# Patient Record
Sex: Female | Born: 1940 | Race: White | Hispanic: No
Health system: Southern US, Community
[De-identification: ages and names within clinical notes are randomized; demographics above are authoritative.]

## PROBLEM LIST (undated history)

## (undated) DIAGNOSIS — F0280 Dementia in other diseases classified elsewhere without behavioral disturbance: Secondary | ICD-10-CM

---

## 2018-03-11 ENCOUNTER — Emergency Department (HOSPITAL_COMMUNITY): Admission: EM | Admit: 2018-03-11 | Payer: Self-pay | Source: Home / Self Care

## 2018-03-11 ENCOUNTER — Other Ambulatory Visit: Payer: Self-pay

## 2018-03-11 ENCOUNTER — Emergency Department (HOSPITAL_COMMUNITY)
Admission: EM | Admit: 2018-03-11 | Discharge: 2018-03-12 | Disposition: A | Discharge status: Home / Self Care | Payer: Medicare Other | Attending: Emergency Medicine | Admitting: Emergency Medicine

## 2018-03-11 DIAGNOSIS — I10 Essential (primary) hypertension: Secondary | ICD-10-CM | POA: Insufficient documentation

## 2018-03-11 DIAGNOSIS — W19XXXA Unspecified fall, initial encounter: Secondary | ICD-10-CM

## 2018-03-11 DIAGNOSIS — Z043 Encounter for examination and observation following other accident: Secondary | ICD-10-CM | POA: Insufficient documentation

## 2018-03-11 DIAGNOSIS — F0390 Unspecified dementia without behavioral disturbance: Secondary | ICD-10-CM | POA: Insufficient documentation

## 2018-03-11 DIAGNOSIS — W010XXA Fall on same level from slipping, tripping and stumbling without subsequent striking against object, initial encounter: Secondary | ICD-10-CM | POA: Diagnosis not present

## 2018-03-11 NOTE — ED Triage Notes (Signed)
Patient brought in by EMS from SNF c/o witnessed fall. Per EMS caregiver stated he fell on her back and hit her head in the floor. No LOC reported. Pt Denies pain.

## 2018-03-11 NOTE — ED Notes (Signed)
Bed: WA20 Expected date:  Expected time:  Means of arrival:  Comments: Ems-fall  

## 2018-03-12 ENCOUNTER — Emergency Department (HOSPITAL_COMMUNITY): Payer: Medicare Other

## 2018-03-12 NOTE — ED Provider Notes (Signed)
[facility]   CSN: [id number] Arrival date & time: 03/11/18  2337     History   Chief Complaint Chief Complaint  Patient presents with  . Fall    HPI Madison Walton is a 77 y.o. female.  HPI    77 year old female with history of severe dementia here with fall.  Fall was witnessed.  The patient reportedly fell backwards and struck her head.  She was able to get herself up.  She is not on blood thinners.  She was sent here for further evaluation.  On my assessment, patient unable to provide history due to severe dementia.  Level 5 caveat invoked as remainder of history, ROS, and physical exam limited due to patient's dementia.   No past medical history on file.   Dementia, severe HTN  There are no active problems to display for this patient.  No relevant SHx  FHx: unable to assess   OB History   None      Home Medications    Prior to Admission medications   Not on File    Family History No family history on file.  Social History Social History   Tobacco Use  . Smoking status: Not on file  Substance Use Topics  . Alcohol use: Not on file  . Drug use: Not on file     Allergies   Patient has no allergy information on record.   Review of Systems Review of Systems  Unable to perform ROS: Mental status change     Physical Exam Updated Vital Signs BP (!) 151/106   Pulse (!) 55   Temp 97.9 F (36.6 C) (Oral)   Resp 16   SpO2 98%   Physical Exam  Constitutional: She appears well-developed and well-nourished. No distress.  HENT:  Head: Normocephalic and atraumatic.  No apparent head trauma. No hemotympanum. No bruising. No hematomas.  Eyes: Conjunctivae are normal.  Neck: Neck supple.  Cardiovascular: Normal rate, regular rhythm and normal heart sounds.  Pulmonary/Chest: Effort normal. No respiratory distress. She has no wheezes.  Abdominal: She exhibits no distension.    Musculoskeletal: She exhibits no edema.  No midline or paraspinal TTP C,T,L spine. No TTP over b/l UE and LE.  Neurological: She is alert. She exhibits normal muscle tone.  Disoriented, which is baseline. MAE. Face symmetric.  Skin: Skin is warm. Capillary refill takes less than 2 seconds. No rash noted.  Nursing note and vitals reviewed.    ED Treatments / Results  Labs (all labs ordered are listed, but only abnormal results are displayed) Labs Reviewed - No data to display  EKG None  Radiology Ct Head Wo Contrast  Result Date: 03/12/2018 CLINICAL DATA:  Patient fell at nursing home hitting the posterior head. EXAM: CT HEAD WITHOUT CONTRAST CT CERVICAL SPINE WITHOUT CONTRAST TECHNIQUE: Multidetector CT imaging of the head and cervical spine was performed following the standard protocol without intravenous contrast. Multiplanar CT image reconstructions of the cervical spine were also generated. COMPARISON:  None. FINDINGS: CT HEAD FINDINGS BRAIN: There is mild sulcal and moderate to marked ventricular prominence consistent with superficial and central atrophy. No intraparenchymal hemorrhage, mass effect nor midline shift. Periventricular and subcortical white matter hypodensities consistent with chronic mild small vessel ischemic disease are identified. No acute large vascular territory infarcts. No abnormal extra-axial fluid collections. Basal cisterns are not effaced and midline. VASCULAR: Moderate calcific atherosclerosis of the carotid siphons. SKULL: No skull fracture. No significant scalp soft  tissue swelling. SINUSES/ORBITS: The mastoid air-cells are clear. The included paranasal sinuses are well-aerated.The included ocular globes and orbital contents are non-suspicious. OTHER: None. CT CERVICAL SPINE FINDINGS Alignment: Maintained cervical lordosis. Skull base and vertebrae: No acute fracture. No primary bone lesion or focal pathologic process. Soft tissues and spinal canal: No  prevertebral fluid or swelling. No visible canal hematoma. Disc levels: Moderate to marked disc flattening C3 through C6 with small posterior marginal osteophytes. Uncovertebral joint osteoarthritis is identified bilaterally with uncinate spurring from C3 through C6. These contribute to mild left and mild-to-moderate right foraminal encroachment, greatest at C4-5 and C5-6. Upper chest: Scarring at the apices.  No dominant mass. Other: Bilateral left-greater-than-right extracranial carotid arteriosclerosis. IMPRESSION: 1. Superficial and central atrophy with minimal small vessel ischemic disease of the brain. No acute intracranial abnormality. 2. No acute cervical spine fracture or static listhesis. 3. 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 Electronically Signed   By: Tollie Eth M.D.   On: 03/12/2018 02:01   Ct Cervical Spine Wo Contrast  Result Date: 03/12/2018 CLINICAL DATA:  Patient fell at nursing home hitting the posterior head. EXAM: CT HEAD WITHOUT CONTRAST CT CERVICAL SPINE WITHOUT CONTRAST TECHNIQUE: Multidetector CT imaging of the head and cervical spine was performed following the standard protocol without intravenous contrast. Multiplanar CT image reconstructions of the cervical spine were also generated. COMPARISON:  None. FINDINGS: CT HEAD FINDINGS BRAIN: There is mild sulcal and moderate to marked ventricular prominence consistent with superficial and central atrophy. No intraparenchymal hemorrhage, mass effect nor midline shift. Periventricular and subcortical white matter hypodensities consistent with chronic mild small vessel ischemic disease are identified. No acute large vascular territory infarcts. No abnormal extra-axial fluid collections. Basal cisterns are not effaced and midline. VASCULAR: Moderate calcific atherosclerosis of the carotid siphons. SKULL: No skull fracture. No significant scalp soft tissue swelling.  SINUSES/ORBITS: The mastoid air-cells are clear. The included paranasal sinuses are well-aerated.The included ocular globes and orbital contents are non-suspicious. OTHER: None. CT CERVICAL SPINE FINDINGS Alignment: Maintained cervical lordosis. Skull base and vertebrae: No acute fracture. No primary bone lesion or focal pathologic process. Soft tissues and spinal canal: No prevertebral fluid or swelling. No visible canal hematoma. Disc levels: Moderate to marked disc flattening C3 through C6 with small posterior marginal osteophytes. Uncovertebral joint osteoarthritis is identified bilaterally with uncinate spurring from C3 through C6. These contribute to mild left and mild-to-moderate right foraminal encroachment, greatest at C4-5 and C5-6. Upper chest: Scarring at the apices.  No dominant mass. Other: Bilateral left-greater-than-right extracranial carotid arteriosclerosis. IMPRESSION: 1. Superficial and central atrophy with minimal small vessel ischemic disease of the brain. No acute intracranial abnormality. 2. No acute cervical spine fracture or static listhesis. 3. 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 Electronically Signed   By: Tollie Eth M.D.   On: 03/12/2018 02:01    Procedures Procedures (including critical care time)  Medications Ordered in ED Medications - No data to display   Initial Impression / Assessment and Plan / ED Course  I have reviewed the triage vital signs and the nursing notes.  Pertinent labs & imaging results that were available during my care of the patient were reviewed by me and considered in my medical decision making (see chart for details).     77 yo F here with witnessed fall. Not on anticoagulants. CT neg. No other signs of trauma on full physical. VSS (mild HTN likely  2/2 agitation from BP cuff 2/2 dementia).  Final Clinical Impressions(s) / ED Diagnoses   Final diagnoses:  Fall, initial  encounter    ED Discharge Orders    None       Shaune Pollack, MD 03/12/18 [phone]

## 2018-07-07 ENCOUNTER — Emergency Department (HOSPITAL_COMMUNITY): Payer: Medicare Other

## 2018-07-07 ENCOUNTER — Emergency Department (HOSPITAL_COMMUNITY)
Admission: EM | Admit: 2018-07-07 | Discharge: 2018-07-07 | Disposition: A | Discharge status: Home / Self Care | Payer: Medicare Other | Attending: Emergency Medicine | Admitting: Emergency Medicine

## 2018-07-07 ENCOUNTER — Other Ambulatory Visit: Payer: Self-pay

## 2018-07-07 DIAGNOSIS — F0391 Unspecified dementia with behavioral disturbance: Secondary | ICD-10-CM | POA: Diagnosis not present

## 2018-07-07 DIAGNOSIS — Z79899 Other long term (current) drug therapy: Secondary | ICD-10-CM | POA: Diagnosis not present

## 2018-07-07 DIAGNOSIS — R0602 Shortness of breath: Secondary | ICD-10-CM

## 2018-07-07 DIAGNOSIS — R451 Restlessness and agitation: Secondary | ICD-10-CM | POA: Diagnosis present

## 2018-07-07 LAB — COMPREHENSIVE METABOLIC PANEL
ALT: 14 U/L (ref 0–44)
AST: 24 U/L (ref 15–41)
Albumin: 3.9 g/dL (ref 3.5–5.0)
Alkaline Phosphatase: 90 U/L (ref 38–126)
Anion gap: 7 (ref 5–15)
BUN: 35 mg/dL — ABNORMAL HIGH (ref 8–23)
CHLORIDE: 110 mmol/L (ref 98–111)
CO2: 25 mmol/L (ref 22–32)
CREATININE: 0.8 mg/dL (ref 0.44–1.00)
Calcium: 9 mg/dL (ref 8.9–10.3)
GFR calc Af Amer: >60 mL/min (ref >60)
GFR calc non Af Amer: >60 mL/min (ref >60)
Glucose, Bld: 89 mg/dL (ref 70–99)
POTASSIUM: 3.9 mmol/L (ref 3.5–5.1)
Sodium: 142 mmol/L (ref 135–145)
Total Bilirubin: 0.5 mg/dL (ref 0.3–1.2)
Total Protein: 6.8 g/dL (ref 6.5–8.1)

## 2018-07-07 LAB — CBC WITH DIFFERENTIAL/PLATELET
ABS IMMATURE GRANULOCYTES: 0.03 10*3/uL (ref 0.00–0.07)
BASOS ABS: 0 10*3/uL (ref 0.0–0.1)
Basophils Relative: 1 %
Eosinophils Absolute: 0.1 10*3/uL (ref 0.0–0.5)
Eosinophils Relative: 1 %
HEMATOCRIT: 33.8 % — AB (ref 36.0–46.0)
HEMOGLOBIN: 10.2 g/dL — AB (ref 12.0–15.0)
IMMATURE GRANULOCYTES: 0 %
LYMPHS ABS: 2.2 10*3/uL (ref 0.7–4.0)
LYMPHS PCT: 27 %
MCH: 26.4 pg (ref 26.0–34.0)
MCHC: 30.2 g/dL (ref 30.0–36.0)
MCV: 87.6 fL (ref 80.0–100.0)
Monocytes Absolute: 1.2 10*3/uL — ABNORMAL HIGH (ref 0.1–1.0)
Monocytes Relative: 14 %
NEUTROS ABS: 4.6 10*3/uL (ref 1.7–7.7)
NEUTROS PCT: 57 %
Platelets: 243 10*3/uL (ref 150–400)
RBC: 3.86 MIL/uL — AB (ref 3.87–5.11)
RDW: 17.2 % — ABNORMAL HIGH (ref 11.5–15.5)
WBC: 8.1 10*3/uL (ref 4.0–10.5)
nRBC: 0 % (ref 0.0–0.2)

## 2018-07-07 LAB — URINALYSIS, ROUTINE W REFLEX MICROSCOPIC
Bacteria, UA: NONE SEEN
Bilirubin Urine: NEGATIVE
GLUCOSE, UA: NEGATIVE mg/dL
Ketones, ur: 5 mg/dL — AB
LEUKOCYTES UA: NEGATIVE
NITRITE: NEGATIVE
Protein, ur: NEGATIVE mg/dL
SPECIFIC GRAVITY, URINE: 1.021 (ref 1.005–1.030)
pH: 5 (ref 5.0–8.0)

## 2018-07-07 MED ORDER — LORAZEPAM 2 MG/ML IJ SOLN: 1.0000 mg | Freq: Once | INTRAMUSCULAR | Status: DC

## 2018-07-07 MED ORDER — LORAZEPAM 2 MG/ML IJ SOLN
1.0000 mg | Freq: Once | INTRAMUSCULAR | Status: AC
  Administered 2018-07-07: 1 mg via INTRAVENOUS
  Filled 2018-07-07: qty 1

## 2018-07-07 NOTE — Discharge Instructions (Addendum)
Extensive work-up here tonight without any acute findings to include chest x-ray CT had labs and urinalysis.  Patient did settle down with Ativan.  We gave 1 mg every 6 hours.  Would recommend may be continuing that at the nursing facility.  Patient stable for discharge back to nursing facility.

## 2018-07-07 NOTE — ED Notes (Signed)
Attempted to call [facility] to find out. Patient has dementia, confusion all the time but she is verbal, able to ambulate on her own, patient is incontinent per nursing home. Son was at nursing home today, son was concerned that she was confused and talking to people that weren't there. Nursing home nurse reported she has never seen [place] like she was acting today, she was biting herself and other residents. Son wants a psych eval to maybe get her on Seroquil.

## 2018-07-07 NOTE — ED Triage Notes (Signed)
Patient presented to ed with c/o agitation. Patient been progressive go worse. Patient was given lorazepam 0.5 mg x 2 at nursing facility today. Patient was also given versed 5 mg IM per m.d due to patient being combative on route to ed.

## 2018-07-07 NOTE — ED Notes (Addendum)
Report given to nurse at [facility], patient is in RM 28. [facility] called for transport.

## 2018-07-07 NOTE — ED Notes (Signed)
[facility] notified of need for transport back to [facility] 

## 2018-07-07 NOTE — ED Notes (Signed)
Bed: [id number] Expected date:  Expected time:  Means of arrival:  Comments: Room 25

## 2018-07-07 NOTE — ED Provider Notes (Signed)
[facility] Provider Note   CSN: [id number] Arrival date & time: 07/07/18  1630     History   Chief Complaint No chief complaint on file.   HPI Madison Walton is a 78 y.o. female.  Patient sent in from nursing facility.  Patient has a history of dementia but for today was very agitated.  Apparently bit some of the staff.  EMS gave her Lorazepam March she got Lorazepam at the nursing facility 0.5 mg x 2 EMS gave her 5 mg IM.  When she arrived here she was very sedated.  As time went on she did get a more alert and got a little bit out of control but never got abusive.  Patient continue to receive some Ativan here.     No past medical history on file.  There are no active problems to display for this patient.     OB History   No obstetric history on file.      Home Medications    Prior to Admission medications   Medication Sig Start Date End Date Taking? Authorizing Provider  divalproex (DEPAKOTE SPRINKLE) 125 MG capsule Take 250 mg by mouth 2 (two) times daily.   Yes [provider]  LORazepam (ATIVAN) 0.5 MG tablet Take 0.5 mg by mouth every 6 (six) hours as needed (agitation).   Yes [provider]  mirtazapine (REMERON) 7.5 MG tablet Take 7.5 mg by mouth at bedtime.   Yes [provider]  sertraline (ZOLOFT) 25 MG tablet Take 25 mg by mouth daily.   Yes [provider]  vitamin B-12 (CYANOCOBALAMIN) 500 MCG tablet Take 500 mcg by mouth daily.   Yes [provider]    Family History No family history on file.  Social History Social History   Tobacco Use  . Smoking status: Not on file  Substance Use Topics  . Alcohol use: Not on file  . Drug use: Not on file     Allergies   Patient has no known allergies.   Review of Systems Review of Systems  Unable to perform ROS: Dementia     Physical Exam Updated Vital Signs BP 110/65 (BP Location: Left Arm)   Pulse (!) 52    Temp 98 F (36.7 C) (Oral)   Resp 16   SpO2 95%   Physical Exam Vitals signs and nursing note reviewed.  Constitutional:      General: She is not in acute distress.    Appearance: She is well-developed.  HENT:     Head: Normocephalic and atraumatic.     Mouth/Throat:     Mouth: Mucous membranes are moist.  Eyes:     Extraocular Movements: Extraocular movements intact.     Conjunctiva/sclera: Conjunctivae normal.     Pupils: Pupils are equal, round, and reactive to light.  Neck:     Musculoskeletal: Neck supple.  Cardiovascular:     Rate and Rhythm: Normal rate and regular rhythm.     Heart sounds: No murmur.  Pulmonary:     Effort: Pulmonary effort is normal. No respiratory distress.     Breath sounds: Normal breath sounds.  Abdominal:     Palpations: Abdomen is soft.     Tenderness: There is no abdominal tenderness.  Musculoskeletal: Normal range of motion.  Skin:    General: Skin is warm and dry.  Neurological:     General: No focal deficit present.     Mental Status: She is alert.  Comments: Patient alert.  At times will get agitated.  Difficult to refocus.  But not aggressive or abusive.      ED Treatments / Results  Labs (all labs ordered are listed, but only abnormal results are displayed) Labs Reviewed  URINALYSIS, ROUTINE W REFLEX MICROSCOPIC - Abnormal; Notable for the following components:      Result Value   Hgb urine dipstick MODERATE (*)    Ketones, ur 5 (*)    All other components within normal limits  COMPREHENSIVE METABOLIC PANEL - Abnormal; Notable for the following components:   BUN 35 (*)    All other components within normal limits  CBC WITH DIFFERENTIAL/PLATELET - Abnormal; Notable for the following components:   RBC 3.86 (*)    Hemoglobin 10.2 (*)    HCT 33.8 (*)    RDW 17.2 (*)    Monocytes Absolute 1.2 (*)    All other components within normal limits  URINE CULTURE    EKG None  Radiology Dg Chest 1 View  Result Date:  07/07/2018 CLINICAL DATA:  Altered mental status, combative, uncooperative EXAM: CHEST  1 VIEW COMPARISON:  None FINDINGS: Rotated to the RIGHT. Normal heart size and pulmonary vascularity. Large opacity at the inferior mediastinum may either represent a rotated cardiac silhouette or potentially a superimposed large hiatal hernia Lungs clear. No pulmonary infiltrate, pleural effusion or pneumothorax. Bones demineralized. Chronic RIGHT rotator cuff tear. IMPRESSION: Question large hiatal hernia; recommend attention on follow-up imaging to confirm. No acute infiltrate. Electronically Signed   By: Ulyses SouthwardMark  Boles M.D.   On: 07/07/2018 18:45   Ct Head Wo Contrast  Result Date: 07/07/2018 CLINICAL DATA:  Altered level of consciousness.  Agitation. EXAM: CT HEAD WITHOUT CONTRAST TECHNIQUE: Contiguous axial images were obtained from the base of the skull through the vertex without intravenous contrast. COMPARISON:  03/12/2018 FINDINGS: Despite efforts by the technologist and patient, motion artifact is present on today's exam and could not be eliminated. This reduces exam sensitivity and specificity. Brain: The stable ventriculomegaly and periventricular white matter hypodensity. The temporal horns are less dilated than the occipital horns. This is similar to previous. Generalized atrophy with prominent sulci. Vascular: There is atherosclerotic calcification of the cavernous carotid arteries bilaterally. Skull: Unremarkable Sinuses/Orbits: Unremarkable Other: No supplemental non-categorized findings. IMPRESSION: 1. No acute intracranial findings. 2. Stable ventriculomegaly and generalized atrophy. 3. Mild periventricular white matter hypodensity probably a manifestation of chronic ischemic microvascular white matter disease. 4. Atherosclerosis. Electronically Signed   By: Gaylyn RongWalter  Liebkemann M.D.   On: 07/07/2018 17:58    Procedures Procedures (including critical care time)  Medications Ordered in ED Medications    LORazepam (ATIVAN) injection 1 mg (has no administration in time range)  LORazepam (ATIVAN) injection 1 mg (1 mg Intravenous Given 07/07/18 1918)     Initial Impression / Assessment and Plan / ED Course  I have reviewed the triage vital signs and the nursing notes.  Pertinent labs & imaging results that were available during my care of the patient were reviewed by me and considered in my medical decision making (see chart for details).     Patient here with history of dementia.  Extensive work-up to include labs chest x-ray CT head without any acute findings.  Patient seemed to do well with Ativan every 6 hours.  Do not see any indication for psych evaluation patient due to her dementia they can communicate well with.  But no evidence of any lab x-ray or head CT abnormalities also no  evidence of urinary tract infection.  Patient now resting comfortably.  Feels patient stable to return back to facility.  Her doctor  Final Clinical Impressions(s) / ED Diagnoses   Final diagnoses:  Dementia with behavioral disturbance, unspecified dementia type [facility])    ED Discharge Orders    None       Vanetta Mulders, MD 07/07/18 2305

## 2018-07-07 NOTE — ED Notes (Signed)
Bed: DG64 Expected date:  Expected time:  Means of arrival:  Comments: 78 yo hallucinations

## 2018-07-07 NOTE — ED Notes (Signed)
Patient was straight cathed for a urine sample. About 50 mL of urine returned. New brief and new linens were given, as patient is incontinent. Mittens applied as well as new gown and warm blanket.

## 2018-07-07 NOTE — ED Notes (Signed)
Patient transported to CT 

## 2018-07-10 LAB — URINE CULTURE: CULTURE: NO GROWTH

## 2018-11-07 ENCOUNTER — Other Ambulatory Visit: Payer: Self-pay

## 2018-11-07 ENCOUNTER — Emergency Department (HOSPITAL_COMMUNITY): Payer: Medicare Other

## 2018-11-07 ENCOUNTER — Encounter (HOSPITAL_COMMUNITY): Payer: Self-pay | Admitting: Emergency Medicine

## 2018-11-07 ENCOUNTER — Emergency Department (HOSPITAL_COMMUNITY)
Admission: EM | Admit: 2018-11-07 | Discharge: 2018-11-07 | Disposition: A | Discharge status: Home / Self Care | Payer: Medicare Other | Attending: Emergency Medicine | Admitting: Emergency Medicine

## 2018-11-07 DIAGNOSIS — Z23 Encounter for immunization: Secondary | ICD-10-CM | POA: Insufficient documentation

## 2018-11-07 DIAGNOSIS — S0101XA Laceration without foreign body of scalp, initial encounter: Secondary | ICD-10-CM | POA: Diagnosis present

## 2018-11-07 DIAGNOSIS — Z79899 Other long term (current) drug therapy: Secondary | ICD-10-CM | POA: Diagnosis not present

## 2018-11-07 DIAGNOSIS — Y999 Unspecified external cause status: Secondary | ICD-10-CM | POA: Insufficient documentation

## 2018-11-07 DIAGNOSIS — Y92129 Unspecified place in nursing home as the place of occurrence of the external cause: Secondary | ICD-10-CM | POA: Insufficient documentation

## 2018-11-07 DIAGNOSIS — W0110XA Fall on same level from slipping, tripping and stumbling with subsequent striking against unspecified object, initial encounter: Secondary | ICD-10-CM | POA: Diagnosis not present

## 2018-11-07 DIAGNOSIS — Y939 Activity, unspecified: Secondary | ICD-10-CM | POA: Insufficient documentation

## 2018-11-07 MED ORDER — TETANUS-DIPHTH-ACELL PERTUSSIS 5-2.5-18.5 LF-MCG/0.5 IM SUSP
0.5000 mL | Freq: Once | INTRAMUSCULAR | Status: AC
  Administered 2018-11-07: 0.5 mL via INTRAMUSCULAR
  Filled 2018-11-07 (×2): qty 0.5

## 2018-11-07 MED ORDER — LIDOCAINE-EPINEPHRINE (PF) 2 %-1:200000 IJ SOLN
10.0000 mL | Freq: Once | INTRAMUSCULAR | Status: AC
  Administered 2018-11-07: 10 mL via INTRADERMAL
  Filled 2018-11-07: qty 10

## 2018-11-07 NOTE — Discharge Instructions (Signed)
Have the staples removed in 5 to 7 days.  He can return to the ED or be seen in urgent care or have it done at your doctor's office.

## 2018-11-07 NOTE — ED Notes (Signed)
[facility] at bedside to transport patient back to the facility. Patient belongings taken with patient and PTAR. Patient unable to sign for discharge due to dementia. Patient stable at discharge. Attempted to call report again, with no answer.

## 2018-11-07 NOTE — ED Notes (Signed)
Attempted to contact [facility] to give report, but no answer. [facility] contacted for transport. Paperwork printed and at nurses station. Will continue to monitor patient.

## 2018-11-07 NOTE — ED Notes (Signed)
Bed: WA17 Expected date:  Expected time:  Means of arrival:  Comments: EMS fall 

## 2018-11-07 NOTE — ED Notes (Signed)
Patient transported to CT 

## 2018-11-07 NOTE — ED Triage Notes (Signed)
Pt BIBA from [facility], witnessed mechanical fall.  Pt was walking and tripped, fell to floor hit back of head.  Per facility staff, pt does not take blood thinners. Staff reports pt is at baseline.    Hx of dementia.

## 2018-11-07 NOTE — ED Provider Notes (Signed)
[facility] DEPT Provider Note   CSN: [id number] Arrival date & time: 11/07/18  1830    History   Chief Complaint Chief Complaint  Patient presents with  . Fall    HPI Madison Walton is a 78 y.o. female.     78 yo F with a chief complaints of fall.  Patient has a history of frequent falls and fell while ambulating.  Witnessed fall.  Struck the back of her head.  Currently at her mental baseline per the nursing home.  Patient is demented and difficult to obtain history level 5 caveat dementia.  The history is provided by the patient.  Fall  This is a new problem. The current episode started 3 to 5 hours ago. The problem occurs constantly. The problem has not changed since onset.Pertinent negatives include no chest pain, no abdominal pain, no headaches and no shortness of breath. Nothing aggravates the symptoms. Nothing relieves the symptoms. She has tried nothing for the symptoms. The treatment provided no relief.    History reviewed. No pertinent past medical history.  There are no active problems to display for this patient.   History reviewed. No pertinent surgical history.   OB History   No obstetric history on file.      Home Medications    Prior to Admission medications   Medication Sig Start Date End Date Taking? Authorizing Provider  divalproex (DEPAKOTE SPRINKLE) 125 MG capsule Take 250 mg by mouth 2 (two) times daily.    [provider]  LORazepam (ATIVAN) 0.5 MG tablet Take 0.5 mg by mouth every 6 (six) hours as needed (agitation).    [provider]  mirtazapine (REMERON) 7.5 MG tablet Take 7.5 mg by mouth at bedtime.    [provider]  sertraline (ZOLOFT) 25 MG tablet Take 25 mg by mouth daily.    [provider]  vitamin B-12 (CYANOCOBALAMIN) 500 MCG tablet Take 500 mcg by mouth daily.    [provider]    Family History No family history on file.  Social History  Social History   Tobacco Use  . Smoking status: Not on file  Substance Use Topics  . Alcohol use: Not on file  . Drug use: Not on file     Allergies   Patient has no known allergies.   Review of Systems Review of Systems  Constitutional: Negative for chills and fever.  HENT: Negative for congestion and rhinorrhea.   Eyes: Negative for redness and visual disturbance.  Respiratory: Negative for shortness of breath and wheezing.   Cardiovascular: Negative for chest pain and palpitations.  Gastrointestinal: Negative for abdominal pain, nausea and vomiting.  Genitourinary: Negative for dysuria and urgency.  Musculoskeletal: Negative for arthralgias and myalgias.  Skin: Positive for wound. Negative for pallor.  Neurological: Negative for dizziness and headaches.     Physical Exam Updated Vital Signs BP 101/78   Pulse 63   Temp 98.2 F (36.8 C) (Oral)   Resp 20   SpO2 99%   Physical Exam Vitals signs and nursing note reviewed.  Constitutional:      General: She is not in acute distress.    Appearance: She is well-developed. She is not diaphoretic.  HENT:     Head: Normocephalic.     Comments: 2.6 cm lac to the occiput.  Eyes:     Pupils: Pupils are equal, round, and reactive to light.  Neck:     Musculoskeletal: Normal range of motion and neck  supple.  Cardiovascular:     Rate and Rhythm: Normal rate and regular rhythm.     Heart sounds: No murmur. No friction rub. No gallop.   Pulmonary:     Effort: Pulmonary effort is normal.     Breath sounds: No wheezing or rales.  Abdominal:     General: There is no distension.     Palpations: Abdomen is soft.     Tenderness: There is no abdominal tenderness.  Musculoskeletal:        General: No tenderness.  Skin:    General: Skin is warm and dry.  Neurological:     Mental Status: She is alert and oriented to person, place, and time.  Psychiatric:        Behavior: Behavior normal.      ED Treatments / Results   Labs (all labs ordered are listed, but only abnormal results are displayed) Labs Reviewed - No data to display  EKG None  Radiology Ct Head Wo Contrast  Result Date: 11/07/2018 CLINICAL DATA:  Mechanical fall. The patient hit the back of their head. EXAM: CT HEAD WITHOUT CONTRAST TECHNIQUE: Contiguous axial images were obtained from the base of the skull through the vertex without intravenous contrast. COMPARISON:  07/07/2018 FINDINGS: Brain: No evidence of acute infarction, hemorrhage, hydrocephalus, extra-axial collection or mass lesion/mass effect. Volume loss and ventriculomegaly is again noted. There are scattered chronic microvascular ischemic changes. Vascular: No hyperdense vessel or unexpected calcification. Skull: Normal. Negative for fracture or focal lesion. Sinuses/Orbits: No acute finding. The patient is status post prior bilateral cataract surgery Other: None. IMPRESSION: 1. No acute intracranial abnormality detected. 2. Chronic findings as above. Electronically Signed   By: Katherine Mantlehristopher  Green M.D.   On: 11/07/2018 19:22    Procedures .Marland Kitchen.Laceration Repair Date/Time: 11/07/2018 7:59 PM Performed by: Melene PlanFloyd, Janece Laidlaw, DO Authorized by: Melene PlanFloyd, Easten Maceachern, DO   Consent:    Consent obtained:  Verbal   Consent given by:  Patient   Risks discussed:  Infection, pain, poor cosmetic result and poor wound healing   Alternatives discussed:  No treatment and delayed treatment Anesthesia (see MAR for exact dosages):    Anesthesia method:  None Laceration details:    Location:  Scalp   Scalp location:  Occipital   Length (cm):  2.6 Repair type:    Repair type:  Simple Pre-procedure details:    Preparation:  Patient was prepped and draped in usual sterile fashion   (including critical care time)  Medications Ordered in ED Medications  lidocaine-EPINEPHrine (XYLOCAINE W/EPI) 2 %-1:200000 (PF) injection 10 mL (10 mLs Intradermal Given by Other 11/07/18 1901)  Tdap (BOOSTRIX) injection 0.5 mL  (0.5 mLs Intramuscular Given 11/07/18 1958)     Initial Impression / Assessment and Plan / ED Course  I have reviewed the triage vital signs and the nursing notes.  Pertinent labs & imaging results that were available during my care of the patient were reviewed by me and considered in my medical decision making (see chart for details).        78 yo F with a chief complaint of a fall.  Sounds mechanical by history.  Has a small act to the occiput.  We will stapled at bedside.  CT of the head.  She has no midline C-spine tenderness is rotating her head 45 degrees without pain.  CT the head is negative for acute intracranial pathology.  The patient's wound was repaired at bedside.  Patient is demented and was having difficulty with even  very mild touch to her wound.  I made the decision to apply one staple instead of 2 injections of lidocaine followed up by a staple. PCP follow up.   8:01 PM:  I have discussed the diagnosis/risks/treatment options with the patient and believe the pt to be eligible for discharge home to follow-up with PCP. We also discussed returning to the ED immediately if new or worsening sx occur. We discussed the sx which are most concerning (e.g., sudden worsening pain, fever, inability to tolerate by mouth) that necessitate immediate return. Medications administered to the patient during their visit and any new prescriptions provided to the patient are listed below.  Medications given during this visit Medications  lidocaine-EPINEPHrine (XYLOCAINE W/EPI) 2 %-1:200000 (PF) injection 10 mL (10 mLs Intradermal Given by Other 11/07/18 1901)  Tdap (BOOSTRIX) injection 0.5 mL (0.5 mLs Intramuscular Given 11/07/18 1958)     The patient appears reasonably screen and/or stabilized for discharge and I doubt any other medical condition or other [facility] requiring further screening, evaluation, or treatment in the ED at this time prior to discharge.    Final Clinical Impressions(s) / ED  Diagnoses   Final diagnoses:  Laceration of scalp, initial encounter    ED Discharge Orders    None       Melene PlanFloyd, Elsbeth Yearick, DO 11/07/18 2001

## 2018-12-21 ENCOUNTER — Emergency Department (HOSPITAL_COMMUNITY): Payer: Medicare Other

## 2018-12-21 ENCOUNTER — Emergency Department (HOSPITAL_COMMUNITY)
Admission: EM | Admit: 2018-12-21 | Discharge: 2018-12-21 | Disposition: A | Discharge status: Home / Self Care | Payer: Medicare Other | Attending: Emergency Medicine | Admitting: Emergency Medicine

## 2018-12-21 ENCOUNTER — Other Ambulatory Visit: Payer: Self-pay

## 2018-12-21 DIAGNOSIS — Z79899 Other long term (current) drug therapy: Secondary | ICD-10-CM | POA: Insufficient documentation

## 2018-12-21 DIAGNOSIS — F0390 Unspecified dementia without behavioral disturbance: Secondary | ICD-10-CM | POA: Diagnosis not present

## 2018-12-21 DIAGNOSIS — W19XXXA Unspecified fall, initial encounter: Secondary | ICD-10-CM

## 2018-12-21 DIAGNOSIS — W1830XA Fall on same level, unspecified, initial encounter: Secondary | ICD-10-CM | POA: Insufficient documentation

## 2018-12-21 DIAGNOSIS — S0083XA Contusion of other part of head, initial encounter: Secondary | ICD-10-CM | POA: Diagnosis not present

## 2018-12-21 DIAGNOSIS — R51 Headache: Secondary | ICD-10-CM | POA: Diagnosis not present

## 2018-12-21 DIAGNOSIS — Y999 Unspecified external cause status: Secondary | ICD-10-CM | POA: Diagnosis not present

## 2018-12-21 DIAGNOSIS — Y9301 Activity, walking, marching and hiking: Secondary | ICD-10-CM | POA: Diagnosis not present

## 2018-12-21 DIAGNOSIS — S0990XA Unspecified injury of head, initial encounter: Secondary | ICD-10-CM | POA: Diagnosis not present

## 2018-12-21 DIAGNOSIS — Y92129 Unspecified place in nursing home as the place of occurrence of the external cause: Secondary | ICD-10-CM | POA: Diagnosis not present

## 2018-12-21 LAB — CBC WITH DIFFERENTIAL/PLATELET
Abs Immature Granulocytes: 0.02 10*3/uL (ref 0.00–0.07)
Basophils Absolute: 0 10*3/uL (ref 0.0–0.1)
Basophils Relative: 1 %
Eosinophils Absolute: 0.1 10*3/uL (ref 0.0–0.5)
Eosinophils Relative: 1 %
HCT: 40.2 % (ref 36.0–46.0)
Hemoglobin: 12.5 g/dL (ref 12.0–15.0)
Immature Granulocytes: 0 %
Lymphocytes Relative: 25 %
Lymphs Abs: 1.6 10*3/uL (ref 0.7–4.0)
MCH: 27.8 pg (ref 26.0–34.0)
MCHC: 31.1 g/dL (ref 30.0–36.0)
MCV: 89.5 fL (ref 80.0–100.0)
Monocytes Absolute: 0.8 10*3/uL (ref 0.1–1.0)
Monocytes Relative: 11 %
Neutro Abs: 4.1 10*3/uL (ref 1.7–7.7)
Neutrophils Relative %: 62 %
Platelets: 284 10*3/uL (ref 150–400)
RBC: 4.49 MIL/uL (ref 3.87–5.11)
RDW: 16.2 % — ABNORMAL HIGH (ref 11.5–15.5)
WBC: 6.7 10*3/uL (ref 4.0–10.5)
nRBC: 0 % (ref 0.0–0.2)

## 2018-12-21 LAB — BASIC METABOLIC PANEL
Anion gap: 9 (ref 5–15)
BUN: 15 mg/dL (ref 8–23)
CO2: 27 mmol/L (ref 22–32)
Calcium: 9.5 mg/dL (ref 8.9–10.3)
Chloride: 104 mmol/L (ref 98–111)
Creatinine, Ser: 0.64 mg/dL (ref 0.44–1.00)
GFR calc Af Amer: >60 mL/min (ref >60)
GFR calc non Af Amer: >60 mL/min (ref >60)
Glucose, Bld: 96 mg/dL (ref 70–99)
Potassium: 4.1 mmol/L (ref 3.5–5.1)
Sodium: 140 mmol/L (ref 135–145)

## 2018-12-21 LAB — URINALYSIS, ROUTINE W REFLEX MICROSCOPIC
Bilirubin Urine: NEGATIVE
Glucose, UA: NEGATIVE mg/dL
Hgb urine dipstick: NEGATIVE
Ketones, ur: NEGATIVE mg/dL
Leukocytes,Ua: NEGATIVE
Nitrite: NEGATIVE
Protein, ur: NEGATIVE mg/dL
Specific Gravity, Urine: 1.012 (ref 1.005–1.030)
pH: 6 (ref 5.0–8.0)

## 2018-12-21 MED ORDER — ACETAMINOPHEN 325 MG PO TABS
325.0000 mg | ORAL_TABLET | Freq: Once | ORAL | Status: DC
  Filled 2018-12-21: qty 1

## 2018-12-21 NOTE — ED Provider Notes (Signed)
[facility] Provider Note   CSN: [id number] Arrival date & time: 12/21/18  [id number]    History   Chief Complaint Chief Complaint  Patient presents with  . Fall    HPI Madison Walton is a 78 y.o. female with a PMH of Dementia, Legally blind, Depression, and osteoporosis presenting after a witnessed fall this morning. [facility] EMS reports that facility staff witnessed the patient walking with a walker and slowly fall. Patient did hit posterior aspect of head. Facility staff deny LOC. Patient is not on blood thinners. Facility reports patient is at baseline. Patient reports a left sided headache. Patient denies chest pain, shortness of breath, dizziness, abdominal pain, hip pain, back pain, neck pain, or joint pain. Patient is a poor historian due to dementia.   Level 5 caveat.      HPI  No past medical history on file.  There are no active problems to display for this patient.   No past surgical history on file.   OB History   No obstetric history on file.      Home Medications    Prior to Admission medications   Medication Sig Start Date End Date Taking? Authorizing Provider  divalproex (DEPAKOTE SPRINKLE) 125 MG capsule Take 250 mg by mouth 2 (two) times daily.    [provider]  LORazepam (ATIVAN) 0.5 MG tablet Take 0.5 mg by mouth every 6 (six) hours as needed (agitation).    [provider]  mirtazapine (REMERON) 7.5 MG tablet Take 7.5 mg by mouth at bedtime.    [provider]  sertraline (ZOLOFT) 25 MG tablet Take 25 mg by mouth daily.    [provider]  vitamin B-12 (CYANOCOBALAMIN) 500 MCG tablet Take 500 mcg by mouth daily.    [provider]    Family History No family history on file.  Social History Social History   Tobacco Use  . Smoking status: Not on file  Substance Use Topics  . Alcohol use: Not on file  . Drug use: Not  on file     Allergies   Patient has no known allergies.   Review of Systems Review of Systems  Unable to perform ROS: Dementia    Physical Exam Updated Vital Signs BP 131/77 (BP Location: Right Arm)   Pulse 65   Temp 97.6 F (36.4 C) (Axillary)   Resp 16   SpO2 98%   Physical Exam Vitals signs and nursing note reviewed.  Constitutional:      General: She is not in acute distress.    Appearance: She is well-developed. She is not diaphoretic.  HENT:     Head: Normocephalic. No raccoon eyes or Battle's sign.      Right Ear: Tympanic membrane, ear canal and external ear normal. No hemotympanum.     Left Ear: Tympanic membrane, ear canal and external ear normal. No hemotympanum.     Nose: Nose normal. No nasal deformity.     Mouth/Throat:     Mouth: Mucous membranes are moist.     Pharynx: No oropharyngeal exudate or posterior oropharyngeal erythema.  Eyes:     Extraocular Movements: Extraocular movements intact.     Conjunctiva/sclera: Conjunctivae normal.     Pupils: Pupils are equal, round, and reactive to light.  Neck:     Musculoskeletal: Normal range of motion. Muscular tenderness (Mild midline tenderness upon palpation of cervical spine.) present.  Cardiovascular:  Rate and Rhythm: Normal rate and regular rhythm.     Heart sounds: Normal heart sounds. No murmur. No friction rub. No gallop.   Pulmonary:     Effort: Pulmonary effort is normal. No respiratory distress.     Breath sounds: Normal breath sounds. No wheezing or rales.  Chest:     Chest wall: No deformity, swelling or tenderness.  Abdominal:     Palpations: Abdomen is soft.     Tenderness: There is no abdominal tenderness. There is no guarding.  Musculoskeletal: Normal range of motion.        General: No swelling, tenderness or deformity.     Right hip: Normal. She exhibits normal range of motion, normal strength and no tenderness.     Left hip: She exhibits normal range of motion, normal strength  and no tenderness.     Cervical back: She exhibits bony tenderness. She exhibits normal range of motion.     Thoracic back: She exhibits normal range of motion, no tenderness and no bony tenderness.     Lumbar back: Normal. She exhibits normal range of motion, no tenderness and no bony tenderness.     Comments: Patient is moving extremities without difficulty. 5/5 strength in upper and lower extremities. 2+ DP and radial pulses. Sensation intact. Patient is ambulatory.  Skin:    General: Skin is warm.     Findings: No erythema or rash.  Neurological:     Mental Status: She is alert. Mental status is at baseline.     Comments: Patient alert to name only. Patient is unable to state date of birth, place, or today's date. Patient states, "It is 1980 and I am at my sister's house."     ED Treatments / Results  Labs (all labs ordered are listed, but only abnormal results are displayed) Labs Reviewed  CBC WITH DIFFERENTIAL/PLATELET - Abnormal; Notable for the following components:      Result Value   RDW 16.2 (*)    All other components within normal limits  BASIC METABOLIC PANEL  URINALYSIS, ROUTINE W REFLEX MICROSCOPIC    EKG EKG Interpretation  Date/Time:  Friday December 21 2018 07:27:21 EDT Ventricular Rate:  56 PR Interval:    QRS Duration: 93 QT Interval:  419 QTC Calculation: 405 R Axis:   72 Text Interpretation:  Sinus rhythm Probable left atrial enlargement Probable anteroseptal infarct, old No old tracing to compare Confirmed by Dorie Rank [phone]) on 12/21/2018 8:26:55 AM   Radiology Ct Head Wo Contrast  Result Date: 12/21/2018 CLINICAL DATA:  78 year old female with acute head and neck injury following fall. EXAM: CT HEAD WITHOUT CONTRAST CT CERVICAL SPINE WITHOUT CONTRAST TECHNIQUE: Multidetector CT imaging of the head and cervical spine was performed following the standard protocol without intravenous contrast. Multiplanar CT image reconstructions of the cervical spine  were also generated. COMPARISON:  03/12/2018 CT FINDINGS: CT HEAD FINDINGS Brain: No evidence of acute infarction, hemorrhage, hydrocephalus, extra-axial collection or mass lesion/mass effect. Atrophy and chronic small-vessel white matter ischemic changes again noted. Vascular: Carotid atherosclerotic calcifications again noted. Skull: Normal. Negative for fracture or focal lesion. Sinuses/Orbits: No acute abnormality Other: Mild posterior LEFT scalp soft tissue swelling noted. CT CERVICAL SPINE FINDINGS Alignment: No acute subluxation. Unchanged 3 mm spondylolisthesis at C2-3 and C4-5 again noted. Skull base and vertebrae: No acute fracture. No primary bone lesion or focal pathologic process. Soft tissues and spinal canal: No prevertebral fluid or swelling. No visible canal hematoma. Disc levels: Mild to moderate  multilevel degenerative disc disease/spondylosis noted. Upper chest: No acute abnormality Other: None IMPRESSION: 1. No evidence of acute intracranial abnormality. Atrophy and chronic small-vessel white matter ischemic changes. 2. Mild posterior LEFT scalp soft tissue swelling. 3. No static evidence of acute injury to the cervical spine. Chronic degenerative changes as described. Electronically Signed   By: Harmon PierJeffrey  Hu M.D.   On: 12/21/2018 08:38   Ct Cervical Spine Wo Contrast  Result Date: 12/21/2018 CLINICAL DATA:  78 year old female with acute head and neck injury following fall. EXAM: CT HEAD WITHOUT CONTRAST CT CERVICAL SPINE WITHOUT CONTRAST TECHNIQUE: Multidetector CT imaging of the head and cervical spine was performed following the standard protocol without intravenous contrast. Multiplanar CT image reconstructions of the cervical spine were also generated. COMPARISON:  03/12/2018 CT FINDINGS: CT HEAD FINDINGS Brain: No evidence of acute infarction, hemorrhage, hydrocephalus, extra-axial collection or mass lesion/mass effect. Atrophy and chronic small-vessel white matter ischemic changes  again noted. Vascular: Carotid atherosclerotic calcifications again noted. Skull: Normal. Negative for fracture or focal lesion. Sinuses/Orbits: No acute abnormality Other: Mild posterior LEFT scalp soft tissue swelling noted. CT CERVICAL SPINE FINDINGS Alignment: No acute subluxation. Unchanged 3 mm spondylolisthesis at C2-3 and C4-5 again noted. Skull base and vertebrae: No acute fracture. No primary bone lesion or focal pathologic process. Soft tissues and spinal canal: No prevertebral fluid or swelling. No visible canal hematoma. Disc levels: Mild to moderate multilevel degenerative disc disease/spondylosis noted. Upper chest: No acute abnormality Other: None IMPRESSION: 1. No evidence of acute intracranial abnormality. Atrophy and chronic small-vessel white matter ischemic changes. 2. Mild posterior LEFT scalp soft tissue swelling. 3. No static evidence of acute injury to the cervical spine. Chronic degenerative changes as described. Electronically Signed   By: Harmon PierJeffrey  Hu M.D.   On: 12/21/2018 08:38    Procedures Procedures (including critical care time)  Medications Ordered in ED Medications  acetaminophen (TYLENOL) tablet 325 mg (has no administration in time range)     Initial Impression / Assessment and Plan / ED Course  I have reviewed the triage vital signs and the nursing notes.  Pertinent labs & imaging results that were available during my care of the patient were reviewed by me and considered in my medical decision making (see chart for details).  Clinical Course as of Dec 20 904  Fri Dec 21, 2018  0844 1. No evidence of acute intracranial abnormality. Atrophy and chronic small-vessel white matter ischemic changes. 2. Mild posterior LEFT scalp soft tissue swelling. 3. No static evidence of acute injury to the cervical spine. Chronic degenerative changes as described.    CT Head Wo Contrast [AH]  [phone] Called and updated son. Son states he understands and agrees with plan.    [AH]    Clinical Course User Index [AH] Leretha DykesHernandez, [place] P, PA-C      Patient presents after a witnessed fall. Vitals, labs, and imaging reviewed. Patient has full ROM of upper and lower extremities. Patient had a small posterior hematoma and mild headache. CT head and neck are negative for acute intracranial abnormality and no static evidence of acute injury to cervical spine. No concern for closed head injury, lung injury, or intraabdominal injury. Patient is stable in no acute distress. Discussed return precautions and documented with discharge documents. Will discharge patient to facility.   Findings and plan of care discussed with supervising physician Dr. Lynelle DoctorKnapp who personally evaluated and examined this patient.  Final Clinical Impressions(s) / ED Diagnoses   Final diagnoses:  Fall,  initial encounter  Injury of head, initial encounter    ED Discharge Orders    None       Leretha DykesHernandez, Jeray Shugart P, [place]PA-C 12/21/18 [id number]    Linwood DibblesKnapp, Jon, MD 12/22/18 [phone]

## 2018-12-21 NOTE — ED Provider Notes (Signed)
Medical screening examination/treatment/procedure(s) were conducted as a shared visit with non-physician practitioner(s) and myself.  I personally evaluated the patient during the encounter.  Elderly pt with dementia presented with a fall.  In the ED alert, awake, primarily complaining about being examined and wanting to be left alone.  Overall doubt serious injury.  Will check labs, xrays monitor.    EKG Interpretation  Date/Time:  Friday December 21 2018 07:27:21 EDT Ventricular Rate:  56 PR Interval:    QRS Duration: 93 QT Interval:  419 QTC Calculation: 405 R Axis:   72 Text Interpretation:  Sinus rhythm Probable left atrial enlargement Probable anteroseptal infarct, old No old tracing to compare Confirmed by Dorie Rank [phone]) on 12/21/2018 8:26:55 AM         Dorie Rank, MD 12/21/18 [phone]

## 2018-12-21 NOTE — Discharge Instructions (Addendum)
You have been seen today after a fall. Please read and follow all provided instructions.   1. Medications: usual home medications 2. Treatment: rest, drink plenty of fluids 3. Follow Up: Please follow up with your primary doctor in 2-5 days for discussion of your diagnoses and further evaluation after today's visit; if you do not have a primary care doctor use the resource guide provided to find one; Please return to the ER for any new or worsening symptoms. Please obtain all of your results from medical records or have your doctors office obtain the results - share them with your doctor - you should be seen at your doctors office. Call today to arrange your follow up.   Take medications as prescribed. Please review all of the medicines and only take them if you do not have an allergy to them. Return to the emergency room for worsening condition or new concerning symptoms. Follow up with your regular doctor. If you don't have a regular doctor use one of the numbers below to establish a primary care doctor. ?  You should return to the ER if you develop severe or worsening symptoms. If patient has abnormal behavior, develops constant vomiting, becomes lethargic, or starts having any concerning symptoms, please return to the ER.   Emergency Department Resource Guide 1) Find a Doctor and Pay Out of Pocket Although you won't have to find out who is covered by your insurance plan, it is a good idea to ask around and get recommendations. You will then need to call the office and see if the doctor you have chosen will accept you as a new patient and what types of options they offer for patients who are self-pay. Some doctors offer discounts or will set up payment plans for their patients who do not have insurance, but you will need to ask so you aren't surprised when you get to your appointment.  2) Contact Your Local Health Department Not all health departments have doctors that can see patients for sick  visits, but many do, so it is worth a call to see if yours does. If you don't know where your local health department is, you can check in your phone book. The CDC also has a tool to help you locate your state's health department, and many state websites also have listings of all of their local health departments.  3) Find a [facility] If your illness is not likely to be very severe or complicated, you may want to try a walk in clinic. These are popping up all over the country in pharmacies, drugstores, and shopping centers. They're usually staffed by nurse practitioners or physician assistants that have been trained to treat common illnesses and complaints. They're usually fairly quick and inexpensive. However, if you have serious medical issues or chronic medical problems, these are probably not your best option.  No Primary Care Doctor: Call Health Connect at  [phone] - they can help you locate a primary care doctor that  accepts your insurance, provides certain services, etc. Physician Referral Service- [phone]  Chronic Pain Problems: Organization         Address  Phone   Notes  [facility]  [phone] Patients need to be referred by their primary care doctor.   Medication Assistance: Organization         Address  Phone   Notes  [facility] Medication [facility] [address] [place], [place] [address] [phone] --Must  be a resident of [facility] -- Must have NO insurance coverage whatsoever (no Medicaid/ Medicare, etc.) -- The pt. MUST have a primary care doctor that directs their care regularly and follows them in the community   [facility]ist  [phone]   [employer]  [phone]    Agencies that provide inexpensive medical care: Organization         Address  Phone   Notes  [facility]  [phone]   [facility]    [phone]   [facility] [address] [place], [place] [address] [phone]   [facility] [address], [place] [phone]) [phone]   [facility]    [phone]   [facility]    [phone]   [facility]  [address], [place] Phone:  [phone], Fax:  [phone] Hours of Operation:  9 am - 6 pm, M-F.  Also accepts Medicaid/Medicare and self-pay.  [facility]  [address], [place] Phone: [phone]) [phone], Fax: [phone]) [phone]. Hours of Operation:  8:30 am - 5:30 pm, M-F.  Also accepts Medicaid and self-pay.  [facility] [place] [address], [place] Phone: [phone]) [phone]   [facility] [address], [place], [place] [phone], Ext. [phone] Mondays & Thursdays: 7-9 AM.  First 15 patients are seen on a first come, first serve basis.    Medicaid-accepting [facility] Providers:  Organization         Address  Phone   Notes  [facility] [address], [place] [phone] Also accepts self-pay patients.  [facility] [address], [place]  [phone]) [phone]   [facility] [address], [place] [phone]   [facility] [address] [place] [address], [place] [phone]   Renaye RakersVeita Bland [address], [place]   [phone] Only accepts [place] patients after they have their name applied to their card.   Self-Pay (no insurance) in [facility] [place]:  Organization         Address  Phone   Notes  Sickle Cell Patients, [facility] [address], [place] [phone]   [facility] [address], [place] [phone]) [phone]   [facility] [place]  [address], [place] [phone]   [facility]/Dr. Osei-Bonsu  [address] [place] [address],  [place] or [id number], [place] [phone] Phone number for both [place] and [place] locations is the same.  [facility] [address], [place] [phone]   [facility] [address] [place] [address], [place] or [address] [phone]   [facility] [address], [place] [phone], phone; [phone], fax Sees patients 1st and 3rd Saturday of every month.  Must not qualify for public or private insurance (i.e. Medicaid, Medicare, [employer], Veterans' Benefits)  Household income should be no more than 200% of the poverty level The clinic cannot treat you if you are pregnant or think you are pregnant  Sexually transmitted diseases are not treated at the clinic.

## 2018-12-21 NOTE — ED Triage Notes (Signed)
Pt here from [facility]. Had witnessed fall this morning from standing. Quarter size hematoma on the back of her head. Pt denies pain. Pt legally blind, hx of dementia. Pt was ambulatory afterwards, denies pain for EMS. No LOC.

## 2019-01-31 ENCOUNTER — Emergency Department (HOSPITAL_COMMUNITY): Payer: Medicare Other

## 2019-01-31 ENCOUNTER — Other Ambulatory Visit: Payer: Self-pay

## 2019-01-31 ENCOUNTER — Emergency Department (HOSPITAL_COMMUNITY)
Admission: EM | Admit: 2019-01-31 | Discharge: 2019-01-31 | Disposition: A | Discharge status: Home / Self Care | Payer: Medicare Other | Attending: Emergency Medicine | Admitting: Emergency Medicine

## 2019-01-31 DIAGNOSIS — Y92129 Unspecified place in nursing home as the place of occurrence of the external cause: Secondary | ICD-10-CM | POA: Diagnosis not present

## 2019-01-31 DIAGNOSIS — F0390 Unspecified dementia without behavioral disturbance: Secondary | ICD-10-CM | POA: Diagnosis not present

## 2019-01-31 DIAGNOSIS — Y939 Activity, unspecified: Secondary | ICD-10-CM | POA: Insufficient documentation

## 2019-01-31 DIAGNOSIS — S0101XA Laceration without foreign body of scalp, initial encounter: Secondary | ICD-10-CM | POA: Insufficient documentation

## 2019-01-31 DIAGNOSIS — Y999 Unspecified external cause status: Secondary | ICD-10-CM | POA: Diagnosis not present

## 2019-01-31 DIAGNOSIS — W1830XA Fall on same level, unspecified, initial encounter: Secondary | ICD-10-CM | POA: Diagnosis not present

## 2019-01-31 DIAGNOSIS — Z79899 Other long term (current) drug therapy: Secondary | ICD-10-CM | POA: Insufficient documentation

## 2019-01-31 NOTE — Discharge Instructions (Addendum)
Remove staples in 7 to 10 days

## 2019-01-31 NOTE — ED Triage Notes (Signed)
Per EMS: Pt from [facility].  Pt had unwitnessed fall and head laceration.  Pt was quiet after fall, but after a few moments pt was back to her normal self, yelling and cussing at staff.  Pt oriented to self, with hx of dementia.  Pt not on blood thinners.

## 2019-01-31 NOTE — ED Provider Notes (Signed)
[facility]-EMERGENC[facility]PT Provider Note   CSN: [id number] Arrival date & time: 01/31/19  1359     History   Chief Complaint No chief complaint on file.   HPI Maryland PinkFern Sechrest Hanover is a 78 y.o. female.     78 year old female with history of dementia here after unwitnessed fall at the nursing home.  Was noted to have bleeding from her occiput.  Patient was noted to be at her neurological baseline.  Patient is a DNR and she is not on blood thinners.  No other injuries was noted and patient sent here for further evaluation     No past medical history on file.  There are no active problems to display for this patient.   No past surgical history on file.   OB History   No obstetric history on file.      Home Medications    Prior to Admission medications   Medication Sig Start Date End Date Taking? Authorizing Provider  divalproex (DEPAKOTE SPRINKLE) 125 MG capsule Take 250 mg by mouth 2 (two) times daily.    [provider]  LORazepam (ATIVAN) 0.5 MG tablet Take 0.5 mg by mouth every 6 (six) hours as needed (agitation).    [provider]  mirtazapine (REMERON) 7.5 MG tablet Take 7.5 mg by mouth at bedtime.    [provider]  sertraline (ZOLOFT) 25 MG tablet Take 25 mg by mouth daily.    [provider]  vitamin B-12 (CYANOCOBALAMIN) 500 MCG tablet Take 500 mcg by mouth daily.    [provider]    Family History No family history on file.  Social History Social History   Tobacco Use  . Smoking status: Not on file  Substance Use Topics  . Alcohol use: Not on file  . Drug use: Not on file     Allergies   Patient has no known allergies.   Review of Systems Review of Systems  Unable to perform ROS: Dementia     Physical Exam Updated Vital Signs There were no vitals taken for this visit.  Physical Exam Vitals signs and nursing note reviewed.  Constitutional:      General: She is not  in acute distress.    Appearance: Normal appearance. She is well-developed. She is not toxic-appearing.  HENT:     Head:   Eyes:     General: Lids are normal.     Conjunctiva/sclera: Conjunctivae normal.     Pupils: Pupils are equal, round, and reactive to light.  Neck:     Musculoskeletal: Normal range of motion and neck supple.     Thyroid: No thyroid mass.     Trachea: No tracheal deviation.  Cardiovascular:     Rate and Rhythm: Normal rate and regular rhythm.     Heart sounds: Normal heart sounds. No murmur. No gallop.   Pulmonary:     Effort: Pulmonary effort is normal. No respiratory distress.     Breath sounds: Normal breath sounds. No stridor. No decreased breath sounds, wheezing, rhonchi or rales.  Abdominal:     General: Bowel sounds are normal. There is no distension.     Palpations: Abdomen is soft.     Tenderness: There is no abdominal tenderness. There is no rebound.  Musculoskeletal: Normal range of motion.        General: No tenderness.  Skin:    General: Skin is warm and dry.     Findings: No abrasion or rash.  Neurological:  Mental Status: She is alert. Mental status is at baseline. She is confused.     GCS: GCS eye subscore is 4. GCS verbal subscore is 4. GCS motor subscore is 5.     Cranial Nerves: No cranial nerve deficit.     Sensory: No sensory deficit.     Comments: Patient moves all 4 extremities at this time.  Psychiatric:        Attention and Perception: She is inattentive.      ED Treatments / Results  Labs (all labs ordered are listed, but only abnormal results are displayed) Labs Reviewed - No data to display  EKG None  Radiology No results found.  Procedures Procedures (including critical care time)  Medications Ordered in ED Medications - No data to display   Initial Impression / Assessment and Plan / ED Course  I have reviewed the triage vital signs and the nursing notes.  Pertinent labs & imaging results that were  available during my care of the patient were reviewed by me and considered in my medical decision making (see chart for details).        LACERATION REPAIR Performed by: Leota Jacobsen Authorized by: Leota Jacobsen Consent: Verbal consent obtained. Risks and benefits: risks, benefits and alternatives were discussed Consent given by: patient Patient identity confirmed: provided demographic data Prepped and Draped in normal sterile fashion Wound explored  Laceration Location: Scalp  Laceration Length: 1 cm  No Foreign Bodies seen or palpated  Anesthesia: local infiltration   Irrigation method: syringe Amount of cleaning: standard  Skin closure: Staples  Number of sutures: 2 staple    Patient tolerance: Patient tolerated the procedure well with no immediate complications. Patient was stable vital signs here.  No indication for lab work. Head and C-spine CT noted without significant injuries.  Stable for discharge  Final Clinical Impressions(s) / ED Diagnoses   Final diagnoses:  None    ED Discharge Orders    None       Lacretia Leigh, MD 01/31/19 [phone]

## 2019-01-31 NOTE — ED Notes (Signed)
PT[facility] called for transport.  

## 2019-02-15 ENCOUNTER — Emergency Department (HOSPITAL_COMMUNITY)
Admission: EM | Admit: 2019-02-15 | Discharge: 2019-02-15 | Disposition: A | Discharge status: Skilled Nursing Facility | Payer: Medicare Other | Attending: Emergency Medicine | Admitting: Emergency Medicine

## 2019-02-15 ENCOUNTER — Encounter: Payer: Self-pay | Admitting: Emergency Medicine

## 2019-02-15 DIAGNOSIS — F0280 Dementia in other diseases classified elsewhere without behavioral disturbance: Secondary | ICD-10-CM | POA: Insufficient documentation

## 2019-02-15 DIAGNOSIS — G309 Alzheimer's disease, unspecified: Secondary | ICD-10-CM | POA: Diagnosis not present

## 2019-02-15 DIAGNOSIS — Y999 Unspecified external cause status: Secondary | ICD-10-CM | POA: Diagnosis not present

## 2019-02-15 DIAGNOSIS — W19XXXA Unspecified fall, initial encounter: Secondary | ICD-10-CM | POA: Insufficient documentation

## 2019-02-15 DIAGNOSIS — Y939 Activity, unspecified: Secondary | ICD-10-CM | POA: Diagnosis not present

## 2019-02-15 DIAGNOSIS — Z043 Encounter for examination and observation following other accident: Secondary | ICD-10-CM | POA: Diagnosis not present

## 2019-02-15 DIAGNOSIS — Y92128 Other place in nursing home as the place of occurrence of the external cause: Secondary | ICD-10-CM | POA: Insufficient documentation

## 2019-02-15 DIAGNOSIS — F0390 Unspecified dementia without behavioral disturbance: Secondary | ICD-10-CM

## 2019-02-15 HISTORY — DX: Dementia in other diseases classified elsewhere, unspecified severity, without behavioral disturbance, psychotic disturbance, mood disturbance, and anxiety: F02.80

## 2019-02-15 NOTE — ED Provider Notes (Signed)
[facility] DEPT Provider Note   CSN: [id number] Arrival date & time: 02/15/19  1402     History   Chief Complaint Chief Complaint  Patient presents with  . Fall    HPI Madison Walton is a 78 y.o. female.     HPI Patient with history of dementia and frequent falls presents from nursing home after unwitnessed fall.  Patient is not on any blood thinners.  Mental status is at baseline.  She is unable to contribute to history.  Level 5 caveat applies. Past Medical History:  Diagnosis Date  . Alzheimer disease ([place])     There are no active problems to display for this patient.   No past surgical history on file.   OB History   No obstetric history on file.      Home Medications    Prior to Admission medications   Medication Sig Start Date End Date Taking? Authorizing Provider  divalproex (DEPAKOTE SPRINKLE) 125 MG capsule Take 250 mg by mouth 2 (two) times daily.    [provider]  feeding supplement, GLUCERNA SHAKE, (GLUCERNA SHAKE) LIQD Take 237 mLs by mouth 3 (three) times daily between meals.    [provider]  LORazepam (ATIVAN) 0.5 MG tablet Take 0.5 mg by mouth every 6 (six) hours as needed (agitation).    [provider]  LORazepam (ATIVAN) 1 MG tablet Take 1 mg by mouth every 12 (twelve) hours.  12/17/18   [provider]  mirtazapine (REMERON) 7.5 MG tablet Take 7.5 mg by mouth at bedtime.    [provider]  QUEtiapine (SEROQUEL) 25 MG tablet Take 12.5 mg by mouth daily.    [provider]  vitamin B-12 (CYANOCOBALAMIN) 500 MCG tablet Take 500 mcg by mouth daily.    [provider]    Family History No family history on file.  Social History Social History   Tobacco Use  . Smoking status: Not on file  Substance Use Topics  . Alcohol use: Not on file  . Drug use: Not on file     Allergies   Codeine, Pseudoephedrine, and Rivastigmine   Review of  Systems Review of Systems  Unable to perform ROS: Dementia     Physical Exam Updated Vital Signs BP (!) 138/117 Comment: Patient was unco-operative  Pulse 63   Temp 97.9 F (36.6 C) (Axillary)   Resp 18   SpO2 100%   Physical Exam Vitals signs and nursing note reviewed.  Constitutional:      Appearance: Normal appearance. She is well-developed.  HENT:     Head: Normocephalic and atraumatic.     Comments: No obvious head trauma.  Midface is stable.  No facial asymmetry.    Mouth/Throat:     Mouth: Mucous membranes are moist.  Eyes:     Extraocular Movements: Extraocular movements intact.     Pupils: Pupils are equal, round, and reactive to light.  Neck:     Musculoskeletal: Normal range of motion and neck supple. No neck rigidity or muscular tenderness.     Comments: No posterior midline cervical tenderness to palpation. Cardiovascular:     Rate and Rhythm: Normal rate and regular rhythm.     Heart sounds: No murmur. No friction rub. No gallop.   Pulmonary:     Effort: Pulmonary effort is normal. No respiratory distress.     Breath sounds: Normal breath sounds. No stridor. No wheezing, rhonchi or rales.  Chest:  Chest wall: No tenderness.  Abdominal:     General: Bowel sounds are normal.     Palpations: Abdomen is soft.     Tenderness: There is no abdominal tenderness. There is no guarding or rebound.  Musculoskeletal: Normal range of motion.        General: No swelling, tenderness, deformity or signs of injury.     Right lower leg: No edema.     Left lower leg: No edema.     Comments: Pelvis is stable.  Moving all extremities without discomfort.  Lymphadenopathy:     Cervical: No cervical adenopathy.  Skin:    General: Skin is warm and dry.     Findings: No erythema or rash.  Neurological:     General: No focal deficit present.     Mental Status: She is alert.     Comments: Moves all extremities without deficit.  Sensation grossly intact.  Confused but  responsive to questions.  Psychiatric:        Behavior: Behavior normal.      ED Treatments / Results  Labs (all labs ordered are listed, but only abnormal results are displayed) Labs Reviewed - No data to display  EKG None  Radiology No results found.  Procedures Procedures (including critical care time)  Medications Ordered in ED Medications - No data to display   Initial Impression / Assessment and Plan / ED Course  I have reviewed the triage vital signs and the nursing notes.  Pertinent labs & imaging results that were available during my care of the patient were reviewed by me and considered in my medical decision making (see chart for details).        Patient with unwitnessed fall.  No obvious head injury.  No new neurologic deficits.  Will discharge back to nursing home.  Final Clinical Impressions(s) / ED Diagnoses   Final diagnoses:  Fall, initial encounter  Dementia without behavioral disturbance, unspecified dementia type [facility](HCC)    ED Discharge Orders    None       Loren RacerYelverton, Dallis Czaja, MD 02/15/19 1446

## 2019-02-15 NOTE — ED Triage Notes (Signed)
Patient suffered an unwitnessed fall at her facility. She struck the back of her head. EMS was only able to get her heart rate.   EMS vitals: [phone]

## 2019-02-15 NOTE — ED Notes (Signed)
PTAR and facility called.  

## 2019-02-15 NOTE — ED Notes (Signed)
Changed patient and put dry clothes on

## 2019-02-15 NOTE — ED Notes (Signed)
Attempted to keep patient masked. Patient ripped mask off.

## 2019-03-05 ENCOUNTER — Emergency Department (HOSPITAL_COMMUNITY)
Admission: EM | Admit: 2019-03-05 | Discharge: 2019-03-05 | Disposition: A | Discharge status: Home / Self Care | Payer: Medicare Other | Attending: Emergency Medicine | Admitting: Emergency Medicine

## 2019-03-05 DIAGNOSIS — G309 Alzheimer's disease, unspecified: Secondary | ICD-10-CM | POA: Insufficient documentation

## 2019-03-05 DIAGNOSIS — W1830XA Fall on same level, unspecified, initial encounter: Secondary | ICD-10-CM | POA: Insufficient documentation

## 2019-03-05 DIAGNOSIS — Y9389 Activity, other specified: Secondary | ICD-10-CM | POA: Insufficient documentation

## 2019-03-05 DIAGNOSIS — S01112A Laceration without foreign body of left eyelid and periocular area, initial encounter: Secondary | ICD-10-CM | POA: Diagnosis present

## 2019-03-05 DIAGNOSIS — Z79899 Other long term (current) drug therapy: Secondary | ICD-10-CM | POA: Insufficient documentation

## 2019-03-05 DIAGNOSIS — Y92129 Unspecified place in nursing home as the place of occurrence of the external cause: Secondary | ICD-10-CM | POA: Insufficient documentation

## 2019-03-05 DIAGNOSIS — Y999 Unspecified external cause status: Secondary | ICD-10-CM | POA: Diagnosis not present

## 2019-03-05 NOTE — ED Triage Notes (Signed)
Pt had witnessed fall while being assisted by nurse tech at facility, pt fell and hit her face, hematoma on L eyebrow, no LOC, c-collar in place as precaution. Pt uncooperative for EMS, vitals WDL  Baseline- sleepy, occasionally awake and talks to self.

## 2019-03-05 NOTE — ED Provider Notes (Signed)
[facility] EMERGENCY DEPARTMENT Provider Note   CSN: [id number] Arrival date & time: 03/05/19  1800     History   Chief Complaint Chief Complaint  Patient presents with  . Fall    HPI Madison Walton is a 78 y.o. female.     78 yo F with a chief complaint of a fall.  The patient does not want to contribute much to her history.  She understands that she is in the hospital.  She would just like to go back home.  She denies pain anywhere.  The history is provided by the patient.  Fall This is a new problem. The current episode started 1 to 2 hours ago. The problem occurs constantly. The problem has not changed since onset.Pertinent negatives include no chest pain, no abdominal pain, no headaches and no shortness of breath. Nothing aggravates the symptoms. Nothing relieves the symptoms. She has tried nothing for the symptoms. The treatment provided no relief.    Past Medical History:  Diagnosis Date  . Alzheimer disease (HCC)     There are no active problems to display for this patient.   No past surgical history on file.   OB History   No obstetric history on file.      Home Medications    Prior to Admission medications   Medication Sig Start Date End Date Taking? Authorizing Provider  divalproex (DEPAKOTE SPRINKLE) 125 MG capsule Take 250 mg by mouth 2 (two) times daily.    [provider]  feeding supplement, GLUCERNA SHAKE, (GLUCERNA SHAKE) LIQD Take 237 mLs by mouth 3 (three) times daily between meals.    [provider]  LORazepam (ATIVAN) 0.5 MG tablet Take 0.5 mg by mouth every 6 (six) hours as needed (agitation).    [provider]  LORazepam (ATIVAN) 1 MG tablet Take 1 mg by mouth every 12 (twelve) hours.  12/17/18   [provider]  mirtazapine (REMERON) 7.5 MG tablet Take 7.5 mg by mouth at bedtime.    [provider]  QUEtiapine (SEROQUEL) 25 MG tablet Take 12.5 mg by mouth daily.     [provider]  vitamin B-12 (CYANOCOBALAMIN) 500 MCG tablet Take 500 mcg by mouth daily.    [provider]    Family History No family history on file.  Social History Social History   Tobacco Use  . Smoking status: Not on file  Substance Use Topics  . Alcohol use: Not on file  . Drug use: Not on file     Allergies   Codeine, Pseudoephedrine, and Rivastigmine   Review of Systems Review of Systems  Constitutional: Negative for chills and fever.  HENT: Negative for congestion and rhinorrhea.   Eyes: Negative for redness and visual disturbance.  Respiratory: Negative for shortness of breath and wheezing.   Cardiovascular: Negative for chest pain and palpitations.  Gastrointestinal: Negative for abdominal pain, nausea and vomiting.  Genitourinary: Negative for dysuria and urgency.  Musculoskeletal: Negative for arthralgias and myalgias.  Skin: Positive for wound. Negative for pallor.  Neurological: Negative for dizziness and headaches.     Physical Exam Updated Vital Signs BP 112/65 (BP Location: Right Arm)   Pulse 64   Temp 98.2 F (36.8 C) (Oral)   Resp 18   SpO2 100%   Physical Exam Vitals signs and nursing note reviewed.  Constitutional:      General: She is not in acute distress.    Appearance: She is well-developed. She is  not diaphoretic.  HENT:     Head: Normocephalic.     Comments: Patient has a bandage to the left eyebrow.  She is not willing to let me take this off. Eyes:     Pupils: Pupils are equal, round, and reactive to light.  Neck:     Musculoskeletal: Normal range of motion and neck supple.  Cardiovascular:     Rate and Rhythm: Normal rate and regular rhythm.     Heart sounds: No murmur. No friction rub. No gallop.   Pulmonary:     Effort: Pulmonary effort is normal.     Breath sounds: No wheezing or rales.  Abdominal:     General: There is no distension.     Palpations: Abdomen is soft.     Tenderness: There is no  abdominal tenderness.  Musculoskeletal:        General: No tenderness.     Comments: Palpated from head to toe without any obvious areas of bony tenderness.  Exam is somewhat limited due to patient compliance.  Skin:    General: Skin is warm and dry.  Neurological:     Mental Status: She is alert and oriented to person, place, and time.  Psychiatric:        Behavior: Behavior normal.      ED Treatments / Results  Labs (all labs ordered are listed, but only abnormal results are displayed) Labs Reviewed - No data to display  EKG None  Radiology No results found.  Procedures Procedures (including critical care time)  Medications Ordered in ED Medications - No data to display   Initial Impression / Assessment and Plan / ED Course  I have reviewed the triage vital signs and the nursing notes.  Pertinent labs & imaging results that were available during my care of the patient were reviewed by me and considered in my medical decision making (see chart for details).        78 yo F with a chief complaints of fall from standing.  Patient has had frequent falls and is come to the ED in the past for this.  She would not like to have any studies done today.  I talked with her about this at length.  I do not feel it is appropriate for me to sedate her so that we can obtain a CT scan of the head or to address her wound to the left eyebrow.  Patient is declining such therapy and I think should be able to decline at this point in her life.  We will have her follow-up with her family doctor.  6:41 PM:  I have discussed the diagnosis/risks/treatment options with the patient and believe the pt to be eligible for discharge home to follow-up with PCP. We also discussed returning to the ED immediately if new or worsening sx occur. We discussed the sx which are most concerning (e.g., sudden worsening pain, worsening confusion, change in mentation) that necessitate immediate return. Medications  administered to the patient during their visit and any new prescriptions provided to the patient are listed below.  Medications given during this visit Medications - No data to display   The patient appears reasonably screen and/or stabilized for discharge and I doubt any other medical condition or other [facility] requiring further screening, evaluation, or treatment in the ED at this time prior to discharge.    Final Clinical Impressions(s) / ED Diagnoses   Final diagnoses:  Eyebrow laceration, left, initial encounter    ED Discharge  Orders    None       Melene PlanFloyd, Alvaretta Eisenberger, DO 03/05/19 [phone]

## 2019-03-05 NOTE — ED Notes (Signed)
Patient verbalizes understanding of discharge instructions. Opportunity for questioning and answers were provided. Armband removed by staff, pt discharged from ED with PTAR.  

## 2019-03-05 NOTE — Discharge Instructions (Signed)
Please return at any time you would like me to take an image of your head or to address your wound.  Typically we want to repair wounds within 24 hours.  Please keep the area clean and dry.  Follow-up with your family doctor in the office.  If in the future you would not like to be transported to the ED then there are forms that you can fill out and make your wishes known so that you are not transported against your will.

## 2019-05-31 DEATH — deceased

## 2020-11-26 IMAGING — CT CT HEAD W/O CM
3 series · 15 of 46 positions shown, 18 images · non-contrast
Comparison: None.

CLINICAL DATA: Altered level of consciousness, unwitnessed fall

EXAM:
CT HEAD WITHOUT CONTRAST
CT CERVICAL SPINE WITHOUT CONTRAST
TECHNIQUE: Multidetector CT imaging of the head and cervical spine was
performed following the standard protocol without intravenous
contrast. Multiplanar CT image reconstructions of the cervical spine
were also generated.

[Series 3: head wo · axial · 0.43mm/px · z∈[-78,+42]mm · 9 of 29 slices shown, 12 images]
[im 3/29  brain]
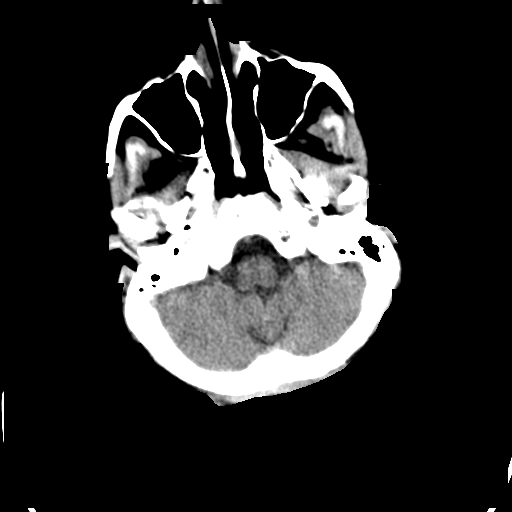
[im 3/29  bone]
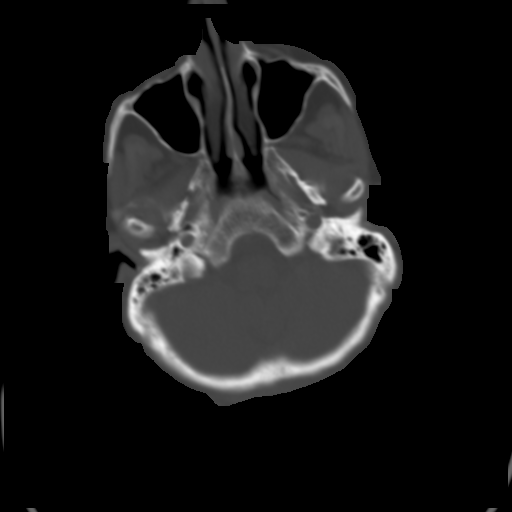
[im 6/29  brain]
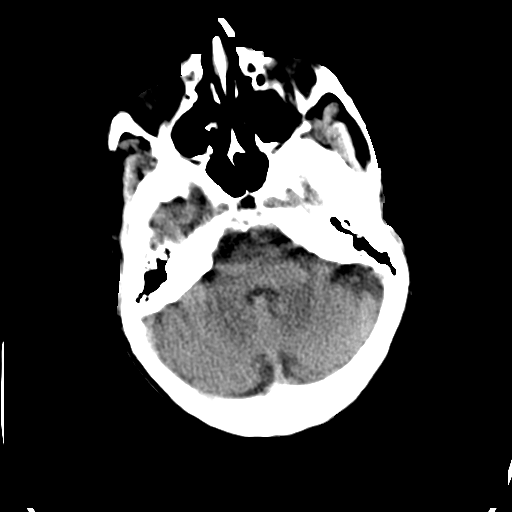
[im 9/29  brain]
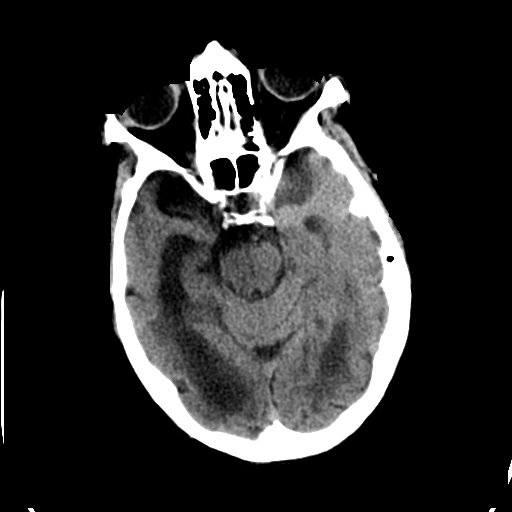
[im 12/29  brain]
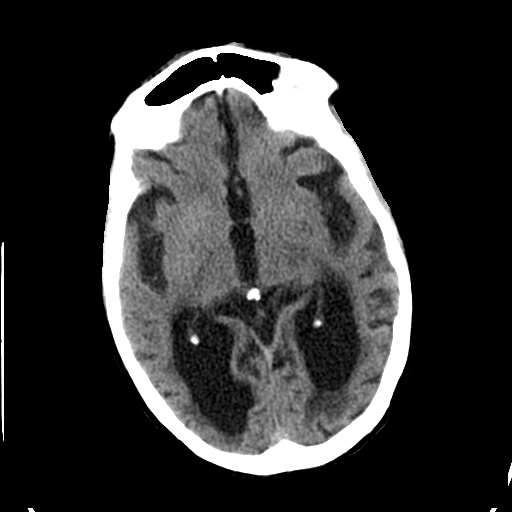
[im 15/29  brain]
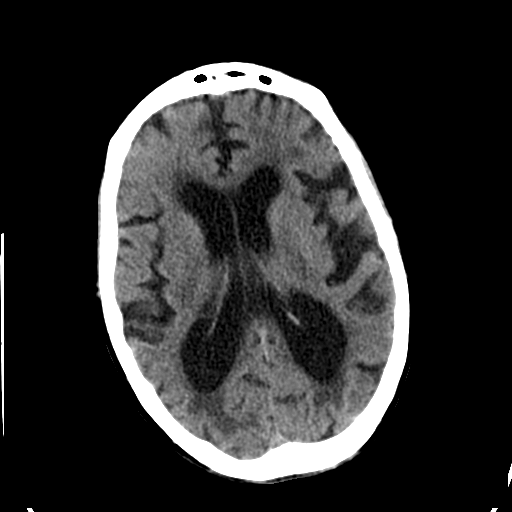
[im 15/29  bone]
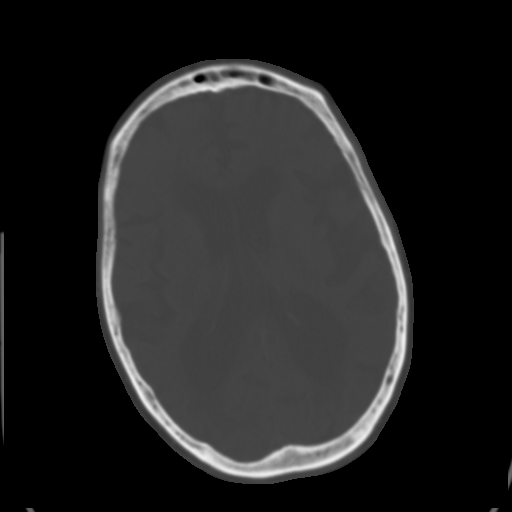
[im 18/29  brain]
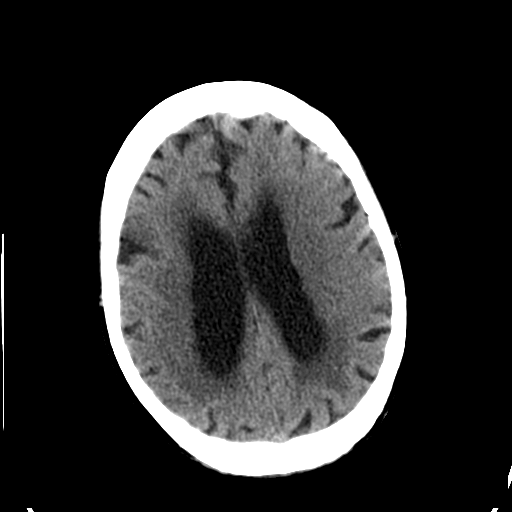
[im 21/29  brain]
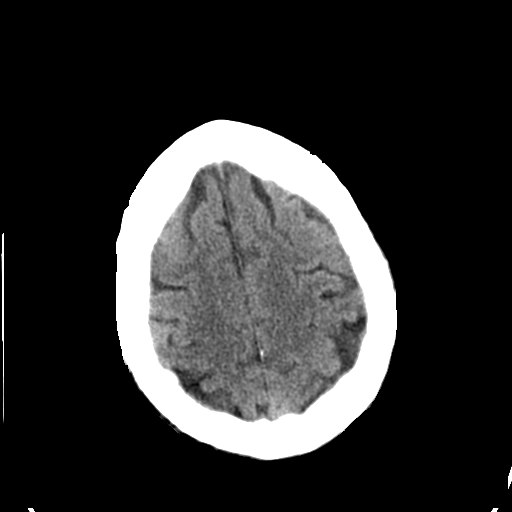
[im 24/29  brain]
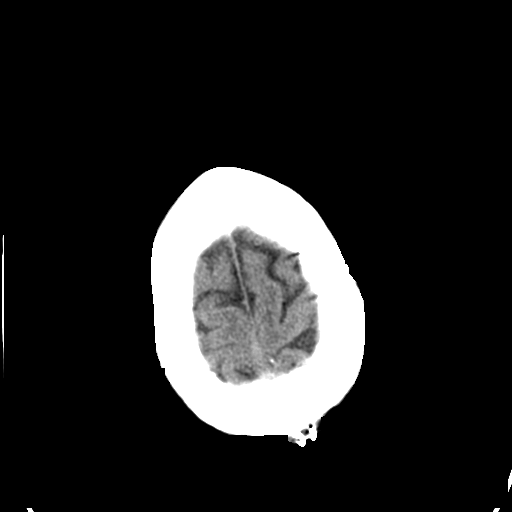
[im 27/29  brain]
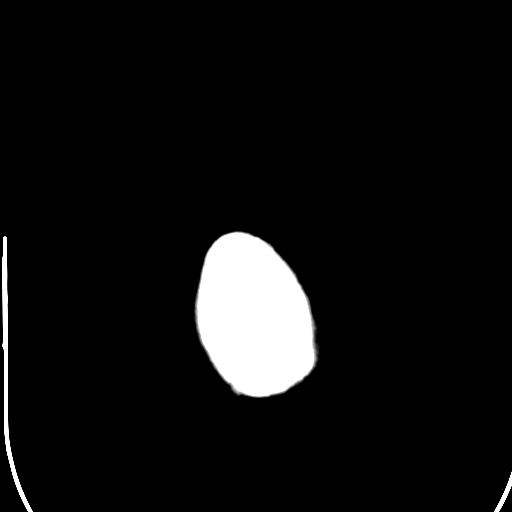
[im 27/29  bone]
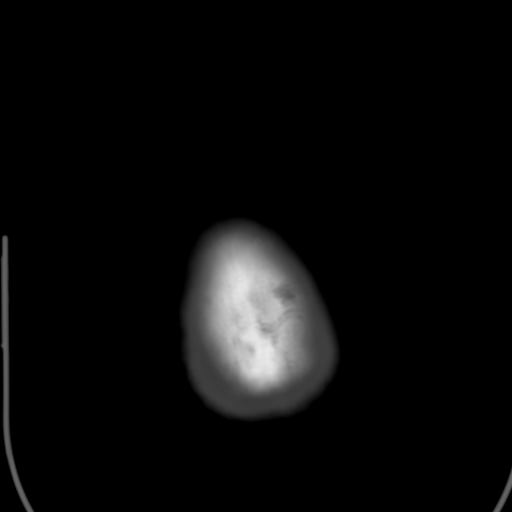

[Series 6: coronal soft tissue · coronal · 0.28mm/px · 3 of 67 slices shown]
[im 23/67  brain]
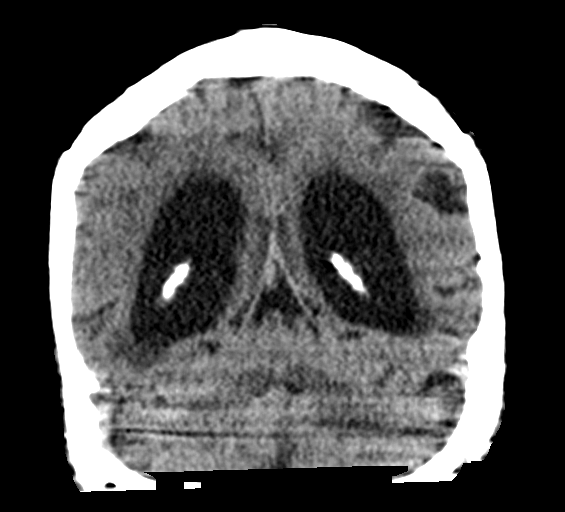
[im 30/67  brain]
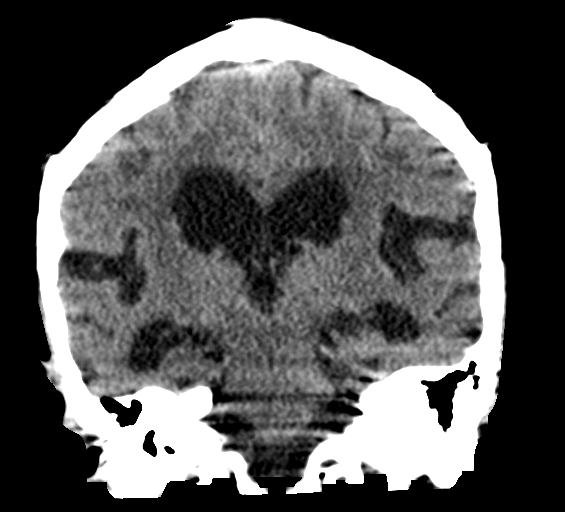
[im 37/67  brain]
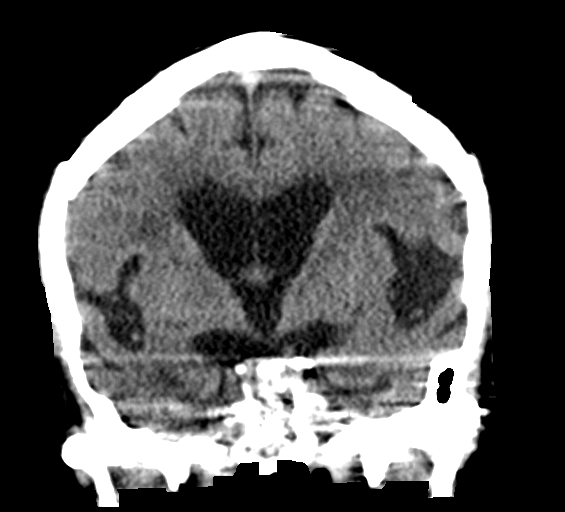

[Series 7: sagittal soft tissue · sagittal · 0.28mm/px · 3 of 46 slices shown]
[im 16/46  brain]
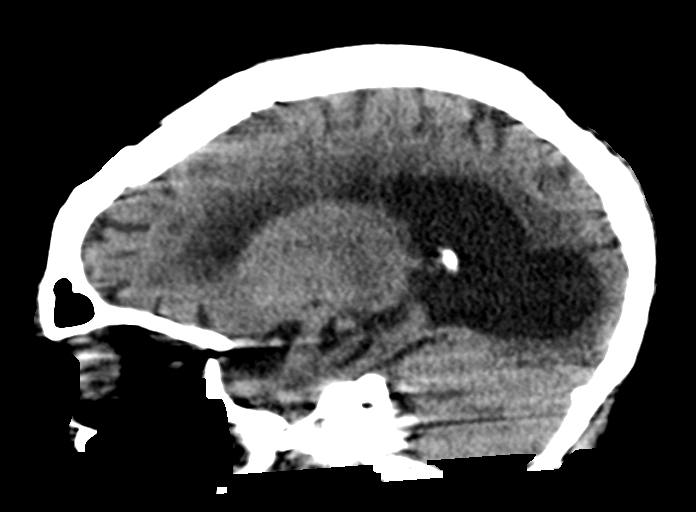
[im 23/46  brain]
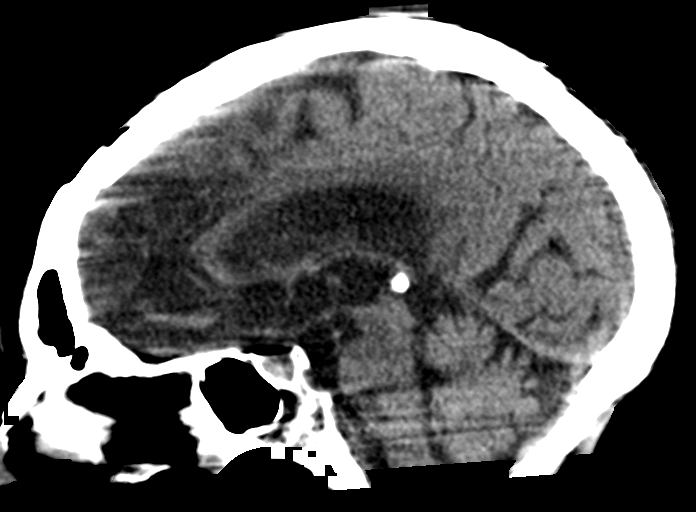
[im 31/46  brain]
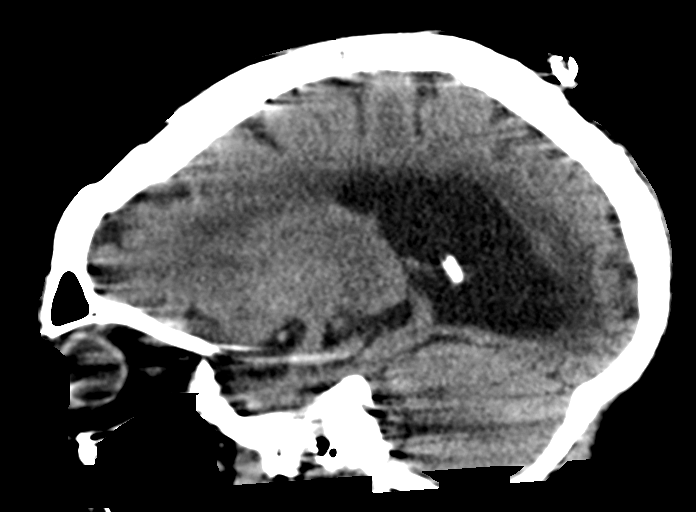

[15 of 46 positions shown; findings below may reference images not displayed]

FINDINGS: CT HEAD FINDINGS

Brain: No evidence of acute infarction, hemorrhage, hydrocephalus,
extra-axial collection or mass lesion/mass effect. Extensive
periventricular and deep white matter hypodensity, global volume
loss, and prominence of the ventricles ex vacuo.

Vascular: No hyperdense vessel or unexpected calcification.

Skull: Normal. Negative for fracture or focal lesion.

Sinuses/Orbits: No acute finding.

Other: Laceration of the scalp vertex with skin staples.

CT CERVICAL SPINE FINDINGS

Evaluation of the cervical spine is mildly limited by motion
artifact throughout.

Alignment: Normal.

Skull base and vertebrae: Osteopenia. No acute fracture. No primary
bone lesion or focal pathologic process.

Soft tissues and spinal canal: No prevertebral fluid or swelling. No
visible canal hematoma.

Disc levels: Mild to moderate multilevel disc space height loss and
osteophytosis.

Upper chest: Negative.

Other: None.
IMPRESSION: 1. No acute intracranial pathology. Advanced small-vessel white
matter disease and global volume loss.

2. Evaluation of the cervical spine is mildly limited by motion
artifact throughout. Within this limitation, no fracture or static
subluxation.
